# Patient Record
Sex: Female | Born: 1985 | Race: Black or African American | Hispanic: No | Marital: Single
Health system: Southern US, Community
[De-identification: ages and names within clinical notes are randomized; demographics above are authoritative.]

## PROBLEM LIST (undated history)

## (undated) DIAGNOSIS — N809 Endometriosis, unspecified: Secondary | ICD-10-CM

## (undated) HISTORY — PX: CHOLECYSTECTOMY: SHX55

---

## 2017-01-04 ENCOUNTER — Emergency Department: Payer: Medicaid HMO

## 2017-01-04 ENCOUNTER — Emergency Department
Admission: EM | Admit: 2017-01-04 | Discharge: 2017-01-04 | Disposition: A | Discharge status: Home / Self Care | Payer: Medicaid HMO | Attending: Emergency Medicine | Admitting: Emergency Medicine

## 2017-01-04 DIAGNOSIS — R509 Fever, unspecified: Secondary | ICD-10-CM | POA: Insufficient documentation

## 2017-01-04 DIAGNOSIS — R6889 Other general symptoms and signs: Secondary | ICD-10-CM

## 2017-01-04 DIAGNOSIS — R1013 Epigastric pain: Secondary | ICD-10-CM | POA: Insufficient documentation

## 2017-01-04 DIAGNOSIS — R05 Cough: Secondary | ICD-10-CM | POA: Insufficient documentation

## 2017-01-04 DIAGNOSIS — J029 Acute pharyngitis, unspecified: Secondary | ICD-10-CM | POA: Insufficient documentation

## 2017-01-04 LAB — CBC AND DIFFERENTIAL
Absolute NRBC: 0 10*3/uL
Basophils Absolute Automated: 0.01 10*3/uL (ref 0.00–0.20)
Basophils Automated: 0.1 %
Eosinophils Absolute Automated: 0.12 10*3/uL (ref 0.00–0.70)
Eosinophils Automated: 1.8 %
Hematocrit: 37.4 % (ref 37.0–47.0)
Hgb: 12 g/dL (ref 12.0–16.0)
Immature Granulocytes Absolute: 0.03 10*3/uL
Immature Granulocytes: 0.4 %
Lymphocytes Absolute Automated: 1.91 10*3/uL (ref 0.50–4.40)
Lymphocytes Automated: 28.3 %
MCH: 27.8 pg — ABNORMAL LOW (ref 28.0–32.0)
MCHC: 32.1 g/dL (ref 32.0–36.0)
MCV: 86.6 fL (ref 80.0–100.0)
MPV: 8.9 fL — ABNORMAL LOW (ref 9.4–12.3)
Monocytes Absolute Automated: 0.47 10*3/uL (ref 0.00–1.20)
Monocytes: 7 %
Neutrophils Absolute: 4.22 10*3/uL (ref 1.80–8.10)
Neutrophils: 62.4 %
Nucleated RBC: 0 /100 WBC (ref 0.0–1.0)
Platelets: 243 10*3/uL (ref 140–400)
RBC: 4.32 10*6/uL (ref 4.20–5.40)
RDW: 13 % (ref 12–15)
WBC: 6.76 10*3/uL (ref 3.50–10.80)

## 2017-01-04 LAB — URINALYSIS WITH MICROSCOPIC
Bilirubin, UA: NEGATIVE
Blood, UA: NEGATIVE
Glucose, UA: NEGATIVE
Ketones UA: NEGATIVE
Leukocyte Esterase, UA: NEGATIVE
Nitrite, UA: NEGATIVE
Protein, UR: NEGATIVE
Specific Gravity UA: 1.013 (ref 1.001–1.035)
Urine pH: 7 (ref 5.0–8.0)
Urobilinogen, UA: NEGATIVE mg/dL

## 2017-01-04 LAB — COMPREHENSIVE METABOLIC PANEL
ALT: 11 U/L (ref 0–55)
AST (SGOT): 16 U/L (ref 5–34)
Albumin/Globulin Ratio: 1.1 (ref 0.9–2.2)
Albumin: 3 g/dL — ABNORMAL LOW (ref 3.5–5.0)
Alkaline Phosphatase: 79 U/L (ref 37–106)
Anion Gap: 7 (ref 5.0–15.0)
BUN: 7 mg/dL (ref 7.0–19.0)
Bilirubin, Total: 0.5 mg/dL (ref 0.2–1.2)
CO2: 25 mEq/L (ref 22–29)
Calcium: 8.6 mg/dL (ref 8.5–10.5)
Chloride: 110 mEq/L (ref 100–111)
Creatinine: 0.7 mg/dL (ref 0.6–1.0)
Globulin: 2.7 g/dL (ref 2.0–3.6)
Glucose: 89 mg/dL (ref 70–100)
Potassium: 4.1 mEq/L (ref 3.5–5.1)
Protein, Total: 5.7 g/dL — ABNORMAL LOW (ref 6.0–8.3)
Sodium: 142 mEq/L (ref 136–145)

## 2017-01-04 LAB — HEMOLYSIS INDEX: Hemolysis Index: 2 (ref 0–18)

## 2017-01-04 LAB — HCG QUANTITATIVE: hCG, Quant.: <1.2

## 2017-01-04 LAB — LIPASE: Lipase: 53 U/L (ref 8–78)

## 2017-01-04 LAB — GFR: EGFR: >60

## 2017-01-04 MED ORDER — MORPHINE SULFATE 10 MG/ML IJ/IV SOLN (WRAP)
6.0000 mg | Freq: Once | Status: AC
Start: 2017-01-04 — End: 2017-01-04
  Administered 2017-01-04: 6 mg via INTRAVENOUS
  Filled 2017-01-04: qty 1

## 2017-01-04 MED ORDER — GUAIFENESIN-CODEINE 100-10 MG/5ML PO SYRP
5.0000 mL | ORAL_SOLUTION | Freq: Three times a day (TID) | ORAL | 0 refills | Status: AC | PRN
Start: 2017-01-04 — End: ?

## 2017-01-04 MED ORDER — IOHEXOL 350 MG/ML IV SOLN
100.0000 mL | Freq: Once | INTRAVENOUS | Status: AC | PRN
Start: 2017-01-04 — End: 2017-01-04
  Administered 2017-01-04: 100 mL via INTRAVENOUS

## 2017-01-04 MED ORDER — DICYCLOMINE HCL 10 MG PO CAPS
10.0000 mg | ORAL_CAPSULE | Freq: Four times a day (QID) | ORAL | 0 refills | Status: AC | PRN
Start: 2017-01-04 — End: 2017-01-19

## 2017-01-04 MED ORDER — SODIUM CHLORIDE 0.9 % IV BOLUS
1000.0000 mL | Freq: Once | INTRAVENOUS | Status: AC
Start: 2017-01-04 — End: 2017-01-04
  Administered 2017-01-04: 1000 mL via INTRAVENOUS

## 2017-01-04 MED ORDER — ONDANSETRON 4 MG PO TBDP
4.0000 mg | ORAL_TABLET | Freq: Three times a day (TID) | ORAL | 0 refills | Status: AC | PRN
Start: 2017-01-04 — End: ?

## 2017-01-04 MED ORDER — MORPHINE SULFATE 4 MG/ML IJ/IV SOLN (WRAP)
4.0000 mg | Freq: Once | Status: AC
Start: 2017-01-04 — End: 2017-01-04
  Administered 2017-01-04: 4 mg via INTRAVENOUS
  Filled 2017-01-04: qty 1

## 2017-01-04 MED ORDER — NALOXONE HCL 4 MG/0.1ML NA LIQD
NASAL | 0 refills | Status: AC
Start: 2017-01-04 — End: ?

## 2017-01-04 MED ORDER — ONDANSETRON HCL 4 MG/2ML IJ SOLN
4.0000 mg | Freq: Once | INTRAMUSCULAR | Status: AC
Start: 2017-01-04 — End: 2017-01-04
  Administered 2017-01-04: 4 mg via INTRAVENOUS
  Filled 2017-01-04: qty 2

## 2017-01-04 NOTE — Discharge Instructions (Signed)
Abdominal Pain    You have been diagnosed with abdominal (belly) pain. The cause of your pain is not yet known.    Many things can cause abdominal pain. Examples include viral infections and bowel (intestine) spasms. You might need another examination or more tests to find out why you have pain.    At this time, your pain does not seem to be caused by anything dangerous. You do not need surgery. You do not need to stay in the hospital.     Though we don't believe your condition is dangerous right now, it is important to be careful. Sometimes a problem that seems mild can become serious later. This is why it is very important that you return here or go to the nearest Emergency Department unless you are 100% improved.    Return here or go to the nearest Emergency Department, or follow up with your physician in:   2-3 days.    Drink only clear liquids such as water, clear broth, sports drinks, or clear caffeine-free soft drinks, like 7-Up or Sprite, for the next:   24 hours.    YOU SHOULD SEEK MEDICAL ATTENTION IMMEDIATELY, EITHER HERE OR AT THE NEAREST EMERGENCY DEPARTMENT, IF ANY OF THE FOLLOWING OCCURS:   Your pain does not go away or gets worse.   You cannot keep fluids down or your vomit is dark green.    You vomit blood or see blood in your stool. Blood might be bright red or dark red. It can also be black and look like tar.   You have a fever (temperature higher than 100.4F / 38C) or shaking chills.   Your skin or eyes look yellow or your urine looks brown.   You have severe diarrhea.

## 2017-01-04 NOTE — ED Notes (Signed)
Bed: BL[facility]1  Expected date:   Expected time:   Means of arrival:   Comments:  Medic 842-abdominal pain

## 2017-01-04 NOTE — ED Triage Notes (Signed)
Becky Wilson is a 31 y.o. female BIBA complaining of flu like symptoms epigastric pain and nausea and vomiting x1. Hx of gastric sleeve.BP 132/81   Pulse 77   Temp 97.7 F (36.5 C)   Resp 18   SpO2 99%

## 2017-01-04 NOTE — ED Provider Notes (Signed)
EMERGENCY DEPARTMENT HISTORY AND PHYSICAL EXAM     Physician/Midlevel provider first contact with patient: 01/04/17 1128         Date: 01/04/2017  Patient Name: Becky Wilson    History of Presenting Illness     Chief Complaint   Patient presents with   . Abdominal Pain       History Provided By: Patient    Chief Complaint: Abdominal pain   Duration: past few days   Timing:  Constant  Location: Epigastric   Severity: Moderate  Exacerbating factors: None   Alleviating factors: None   Associated Symptoms: Nausea, vomiting, cough, rhinorrhea, sore throat, fever, myalgias, back pain   Pertinent Negatives: No diarrhea     Additional History: Becky Wilson is a 31 y.o. female with Hx of gastric sleeve surgery presenting to the ED with for epigastric pain, nausea, and vomiting for the past few days. Patient also states she has had a cough, sore throat, rhinorrhea, and fever the past 5 days. She thinks she might have the flu. She reports eating [employer] a few days ago and states she has been recently dehydrated. She also complains of back pain. She denies any diarrhea.     PCP: No primary care provider on file.  SPECIALISTS:    No current facility-administered medications for this encounter.      Current Outpatient Prescriptions   Medication Sig Dispense Refill   . acetaminophen-codeine (TYLENOL #3) 300-30 MG per tablet TAKE 1 TABLET BY MOUTH EVERY 4 TO 6 HOURS AS NEEDED FOR PAIN  0   . dicyclomine (BENTYL) 10 MG capsule Take 1 capsule (10 mg total) by mouth every 6 (six) hours as needed (pain).for up to 15 days 12 capsule 0   . guaiFENesin-codeine (ROBITUSSIN AC) 100-10 MG/5ML syrup Take 5 mLs by mouth 3 (three) times daily as needed for Cough. 118 mL 0   . naloxone (NARCAN) 4 MG/0.1ML Liquid nasal spray 1 spray intranasally. If pt does not respond or relapses into respiratory depression call 911. Give additional doses every 2-3 min. 2 each 0   . ondansetron (ZOFRAN-ODT) 4 MG disintegrating tablet Take 1 tablet (4 mg  total) by mouth every 8 (eight) hours as needed for Nausea. 8 tablet 0   . VENTOLIN HFA 108 (90 Base) MCG/ACT inhaler TAKE 2 PUFFS BY MOUTH EVERY 3 HOURS PRN FOR WHEEZING HOME AND SCHOOL  4       Past History     Past Medical History:  History reviewed. No pertinent past medical history.    Past Surgical History:  Past Surgical History:   Procedure Laterality Date   . LAPAROSCOPIC, GASTRIC BYPASS, CONVERSION FROM SLEEVE GASTRECTOMY         Family History:  No family history on file.    Social History:  Social History   Substance Use Topics   . Smoking status: Not on file   . Smokeless tobacco: Not on file   . Alcohol use No       Allergies:  No Known Allergies    Review of Systems     Review of Systems   Constitutional: Positive for fever. Negative for fatigue.   HENT: Positive for sore throat. Negative for rhinorrhea.    Eyes: Negative for discharge, redness and visual disturbance.   Respiratory: Positive for cough. Negative for shortness of breath.    Cardiovascular: Negative for chest pain and leg swelling.   Gastrointestinal: Positive for abdominal pain, nausea and vomiting. Negative for diarrhea.  Endocrine: Negative for polyuria.   Genitourinary: Negative for dysuria, flank pain, frequency and urgency.   Musculoskeletal: Positive for back pain. Negative for neck pain and neck stiffness.   Skin: Negative for rash.   Allergic/Immunologic: Negative for immunocompromised state.   Neurological: Negative for light-headedness and headaches.   Hematological: Does not bruise/bleed easily.   Psychiatric/Behavioral: Negative for suicidal ideas.       Physical Exam   BP 115/65   Pulse 77   Temp 97.6 F (36.4 C)   Resp 16   SpO2 99%     Constitutional: Vital signs reviewed. Well appearing. No distress.  Head: Normocephalic, atraumatic  Eyes: Conjunctiva and sclera are normal.  No injection or discharge.  Ears, Nose, Throat:  Normal external examination of the ears. (+) clear rhinorrhea. No throat or oropharyngeal  swelling or erythema. Midline uvula. Mucous membranes moist.  Neck: Normal range of motion. Supple, no meningeal signs. Trachea midline. No stridor. No JVD  Respiratory/Chest: Clear to auscultation. No respiratory distress.   Cardiovascular: Regular rate and rhythm. No murmurs.  Abdomen:  Bowel sounds intact. No rebound or guarding. Soft.  (+) epigastric TTP. No other tenderness.   Back: No CVA tenderness to percussion. No focal tenderness.  Upper Extremity:  No edema. No cyanosis. Bilateral radial pulses intact and equal.   Lower Extremity:  No edema. No cyanosis. Bilateral calves symmetrical and non-tender. Bilateral femoral, DP, PT pulses intact and equal.  Skin: Warm and dry. No rash.  Neuro: A&Ox3. CNII -XII intact to testing. Strength 5/5 and symmetrical in the bilateral upper and lower extremities. Sensation to sharp touch intact and equal in the bilateral upper and lower extremities. Normal gait.   Psychiatric: Normal affect.  Normal insight.        Diagnostic Study Results     Labs -     Results     Procedure Component Value Units Date/Time    Urinalysis with microscopic [[id number]] Collected:  01/04/17 1155    Specimen:  Urine Updated:  01/04/17 1314     Urine Type Clean Catch     Color, UA Yellow     Clarity, UA Hazy     Specific Gravity UA 1.013     Urine pH 7.0     Leukocyte Esterase, UA Negative     Nitrite, UA Negative     Protein, UR Negative     Glucose, UA Negative     Ketones UA Negative     Urobilinogen, UA Negative mg/dL      Bilirubin, UA Negative     Blood, UA Negative     RBC, UA 0 - 2 /hpf      WBC, UA 0 - 5 /hpf      Squamous Epithelial Cells, Urine 0 - 5 /hpf     Beta HCG Quant Serum [[id number]] Collected:  01/04/17 1156     Updated:  01/04/17 1233     hCG, Quant. <1.2    Comprehensive Metabolic Panel (CMP) [[id number]]  (Abnormal) Collected:  01/04/17 1156    Specimen:  Blood Updated:  01/04/17 1227     Glucose 89 mg/dL      BUN 7.0 mg/dL      Creatinine 0.7 mg/dL      Sodium 956 mEq/L       Potassium 4.1 mEq/L      Chloride 110 mEq/L      CO2 25 mEq/L      Calcium 8.6 mg/dL  Protein, Total 5.7 (L) g/dL      Albumin 3.0 (L) g/dL      AST (SGOT) 16 U/L      ALT 11 U/L      Alkaline Phosphatase 79 U/L      Bilirubin, Total 0.5 mg/dL      Globulin 2.7 g/dL      Albumin/Globulin Ratio 1.1     Anion Gap 7.0    Lipase [[id number]] Collected:  01/04/17 1156    Specimen:  Blood Updated:  01/04/17 1227     Lipase 53 U/L     Hemolysis index [[id number]] Collected:  01/04/17 1156     Updated:  01/04/17 1227     Hemolysis Index 2    GFR [[id number]] Collected:  01/04/17 1156     Updated:  01/04/17 1227     EGFR >60.0    Influenza A/B Virus Antigen [[id number]] Collected:  01/04/17 1156    Specimen:  Nasopharyngeal from Nasal Aspirate Updated:  01/04/17 1224    Narrative:       ORDER#: [id number]                                    ORDERED BY: Lucianne Muss, Sosie Gato  SOURCE: Nasal Aspirate                               COLLECTED:  01/04/17 11:56  ANTIBIOTICS AT COLL.:                                RECEIVED :  01/04/17 12:01  Influenza Rapid Antigen A&B                FINAL       01/04/17 12:23  01/04/17   Negative for Influenza A and B             Reference Range: Negative      CBC with differential [[id number]]  (Abnormal) Collected:  01/04/17 1156    Specimen:  Blood from Blood Updated:  01/04/17 1208     WBC 6.76 x10 3/uL      Hgb 12.0 g/dL      Hematocrit 53.6 %      Platelets 243 x10 3/uL      RBC 4.32 x10 6/uL      MCV 86.6 fL      MCH 27.8 (L) pg      MCHC 32.1 g/dL      RDW 13 %      MPV 8.9 (L) fL      Neutrophils 62.4 %      Lymphocytes Automated 28.3 %      Monocytes 7.0 %      Eosinophils Automated 1.8 %      Basophils Automated 0.1 %      Immature Granulocyte 0.4 %      Nucleated RBC 0.0 /100 WBC      Neutrophils Absolute 4.22 x10 3/uL      Abs Lymph Automated 1.91 x10 3/uL      Abs Mono Automated 0.47 x10 3/uL      Abs Eos Automated 0.12 x10 3/uL      Absolute Baso Automated 0.01 x10 3/uL      Absolute Immature  Granulocyte 0.03 x10 3/uL      Absolute  NRBC 0.00 x10 3/uL           Radiologic Studies -   Radiology Results (24 Hour)     Procedure Component Value Units Date/Time    CT Abd/ Pelvis with IV Contrast [[id number]] Collected:  01/04/17 1335    Order Status:  Completed Updated:  01/04/17 1341    Narrative:       CLINICAL HISTORY:Abdominal pain. Vomiting x1 day.    COMPARISON: None    TECHNIQUE: Multiple axial CT images through the abdomen pelvis were  obtained following the administration of intravenous contrast. 100 cc of  Omnipaque 350 was administered .  A combination of a automatic exposure  control, adjustment of the mA and/or kV  according to patient size  and/or use of iterative reconstruction technique was utilized.    FINDINGS: There is no basilar pleural or pericardial effusion. Lung  bases are clear.    The liver is normal in attenuation morphology. There is no focal hepatic  lesion. The gallbladder, spleen, adrenal glands, and pancreas are  normal. There is no intra-axial hepatic biliary ductal dilatation.    Kidneys enhance symmetrically. There is no focal renal abnormality or  hydronephrosis. Bladder is grossly normal. Uterus and ovaries are  unremarkable. There is a physiologic cyst in the left ovary which  measures 2.5 cm.    Posterior skull changes of the stomach are noted. There is no evidence  of bowel obstruction or bowel dilatation. The appendix is normal. There  is no abdominal aortic aneurysm. The major mesenteric vessels are  patent. There is no abdominal or pelvic lymphadenopathy. There is trace  free fluid in the cul-de-sac. There is no free air.    There are no destructive osseous lesions.      Impression:        No acute abnormality identified in the abdomen or pelvis.    Wyatt Portela, MD   01/04/2017 1:37 PM      .    Medical Decision Making   I am the first provider for this patient.    I reviewed the vital signs, available nursing notes, past medical history, past surgical history, family  history and social history.    Vital Signs-Reviewed the patient's vital signs.     Patient Vitals for the past 12 hrs:   BP Temp Pulse Resp   01/04/17 1421 115/65 97.6 F (36.4 C) 77 16   01/04/17 1132 132/81 97.7 F (36.5 C) 77 18       Pulse Oximetry Analysis - Normal 99% on RA    Old Medical Records: Nursing notes.     ED Course:   2:16 PM - Abdomen non-tender on repeat exam and pt tolerating PO. Patient is resting comfortably and agrees with plan for discharge. Discussed results with pt and counseled on diagnosis, f/u plans, medication use, supportive care, and signs and symptoms when to return to ED immediately. Pt voices understanding and agreement with plan. All questions and concerns addressed.           Provider Notes: Pt with constellation of symptoms consistent with acute viral symptoms. No significant abnormalities on labs or vital signs. Doubt perforated ulcer, acute cholecystitis, pancreatitis, bowel obstruction, appendicitis, mesenteric ischemia, ruptured AAA, aortic dissection, large obstructing stone, ovarian torsion, or other surgical emergency.           Diagnosis     Clinical Impression:   1. Epigastric abdominal pain    2. Flu-like symptoms  Treatment Plan:   ED Disposition     ED Disposition Condition Date/Time Comment    Discharge  Fri Jan 04, 2017  2:17 PM Becky Wilson discharge to home/self care.    Condition at disposition: Stable            _______________________________      Attestations: This note is prepared by Jaymes Graff, acting as scribe for Dr. Lynnea Ferrier, MD.    Dr. Lynnea Ferrier, MD - The scribe's documentation has been prepared under my direction and personally reviewed by me in its entirety.  I confirm that the note above accurately reflects all work, treatment, procedures, and medical decision making performed by me.    _______________________________       Maryella Shivers, MD  01/05/17 [phone]

## 2019-12-28 ENCOUNTER — Other Ambulatory Visit: Payer: Self-pay

## 2019-12-28 ENCOUNTER — Emergency Department (HOSPITAL_COMMUNITY): Payer: Medicaid - Out of State

## 2019-12-28 ENCOUNTER — Emergency Department (HOSPITAL_COMMUNITY)
Admission: EM | Admit: 2019-12-28 | Discharge: 2019-12-29 | Disposition: A | Discharge status: Home / Self Care | Payer: Medicaid - Out of State | Attending: Emergency Medicine | Admitting: Emergency Medicine

## 2019-12-28 ENCOUNTER — Encounter (HOSPITAL_COMMUNITY): Payer: Self-pay | Admitting: Emergency Medicine

## 2019-12-28 DIAGNOSIS — R42 Dizziness and giddiness: Secondary | ICD-10-CM

## 2019-12-28 DIAGNOSIS — L509 Urticaria, unspecified: Secondary | ICD-10-CM | POA: Insufficient documentation

## 2019-12-28 DIAGNOSIS — R519 Headache, unspecified: Secondary | ICD-10-CM

## 2019-12-28 HISTORY — DX: Endometriosis, unspecified: N80.9

## 2019-12-28 LAB — I-STAT CHEM 8, ED
BUN: 9 mg/dL (ref 6–20)
Calcium, Ion: 1.19 mmol/L (ref 1.15–1.40)
Chloride: 105 mmol/L (ref 98–111)
Creatinine, Ser: 0.7 mg/dL (ref 0.44–1.00)
Glucose, Bld: 89 mg/dL (ref 70–99)
HCT: 35 % — ABNORMAL LOW (ref 36.0–46.0)
Hemoglobin: 11.9 g/dL — ABNORMAL LOW (ref 12.0–15.0)
Potassium: 3.8 mmol/L (ref 3.5–5.1)
Sodium: 139 mmol/L (ref 135–145)
TCO2: 22 mmol/L (ref 22–32)

## 2019-12-28 LAB — DIFFERENTIAL
Abs Immature Granulocytes: 0.01 10*3/uL (ref 0.00–0.07)
Basophils Absolute: 0 10*3/uL (ref 0.0–0.1)
Basophils Relative: 0 %
Eosinophils Absolute: 0.1 10*3/uL (ref 0.0–0.5)
Eosinophils Relative: 2 %
Immature Granulocytes: 0 %
Lymphocytes Relative: 44 %
Lymphs Abs: 2.8 10*3/uL (ref 0.7–4.0)
Monocytes Absolute: 0.5 10*3/uL (ref 0.1–1.0)
Monocytes Relative: 8 %
Neutro Abs: 2.9 10*3/uL (ref 1.7–7.7)
Neutrophils Relative %: 46 %

## 2019-12-28 LAB — COMPREHENSIVE METABOLIC PANEL
ALT: 12 U/L (ref 0–44)
AST: 15 U/L (ref 15–41)
Albumin: 3.1 g/dL — ABNORMAL LOW (ref 3.5–5.0)
Alkaline Phosphatase: 49 U/L (ref 38–126)
Anion gap: 8 (ref 5–15)
BUN: 8 mg/dL (ref 6–20)
CO2: 22 mmol/L (ref 22–32)
Calcium: 8.5 mg/dL — ABNORMAL LOW (ref 8.9–10.3)
Chloride: 109 mmol/L (ref 98–111)
Creatinine, Ser: 0.91 mg/dL (ref 0.44–1.00)
GFR calc Af Amer: >60 mL/min (ref >60)
GFR calc non Af Amer: >60 mL/min (ref >60)
Glucose, Bld: 91 mg/dL (ref 70–99)
Potassium: 3.8 mmol/L (ref 3.5–5.1)
Sodium: 139 mmol/L (ref 135–145)
Total Bilirubin: 0.4 mg/dL (ref 0.3–1.2)
Total Protein: 6.2 g/dL — ABNORMAL LOW (ref 6.5–8.1)

## 2019-12-28 LAB — URINALYSIS, ROUTINE W REFLEX MICROSCOPIC
Bilirubin Urine: NEGATIVE
Glucose, UA: NEGATIVE mg/dL
Hgb urine dipstick: NEGATIVE
Ketones, ur: NEGATIVE mg/dL
Leukocytes,Ua: NEGATIVE
Nitrite: NEGATIVE
Protein, ur: NEGATIVE mg/dL
Specific Gravity, Urine: 1.018 (ref 1.005–1.030)
pH: 6 (ref 5.0–8.0)

## 2019-12-28 LAB — CBC
HCT: 36.8 % (ref 36.0–46.0)
Hemoglobin: 11.6 g/dL — ABNORMAL LOW (ref 12.0–15.0)
MCH: 26.7 pg (ref 26.0–34.0)
MCHC: 31.5 g/dL (ref 30.0–36.0)
MCV: 84.8 fL (ref 80.0–100.0)
Platelets: 334 10*3/uL (ref 150–400)
RBC: 4.34 MIL/uL (ref 3.87–5.11)
RDW: 13.3 % (ref 11.5–15.5)
WBC: 6.3 10*3/uL (ref 4.0–10.5)
nRBC: 0 % (ref 0.0–0.2)

## 2019-12-28 LAB — PROTIME-INR
INR: 1 (ref 0.8–1.2)
Prothrombin Time: 13 seconds (ref 11.4–15.2)

## 2019-12-28 LAB — I-STAT BETA HCG BLOOD, ED (MC, WL, AP ONLY): I-stat hCG, quantitative: <5 m[IU]/mL (ref <5)

## 2019-12-28 LAB — APTT: aPTT: 29 seconds (ref 24–36)

## 2019-12-28 MED ORDER — SODIUM CHLORIDE 0.9% FLUSH
3.0000 mL | Freq: Once | INTRAVENOUS | Status: DC
Start: 2019-12-28 — End: 2019-12-29

## 2019-12-28 NOTE — ED Triage Notes (Signed)
Pt in POV, reports HA, dizziness, blurred vision, diaphoresis, pt reports she is confused.

## 2019-12-29 LAB — RAPID URINE DRUG SCREEN, HOSP PERFORMED
Amphetamines: NOT DETECTED
Barbiturates: NOT DETECTED
Benzodiazepines: NOT DETECTED
Cocaine: NOT DETECTED
Opiates: NOT DETECTED
Tetrahydrocannabinol: NOT DETECTED

## 2019-12-29 LAB — TSH: TSH: 3.043 u[IU]/mL (ref 0.350–4.500)

## 2019-12-29 MED ORDER — SODIUM CHLORIDE 0.9 % IV BOLUS
1000.0000 mL | Freq: Once | INTRAVENOUS | Status: AC
  Administered 2019-12-29: 1000 mL via INTRAVENOUS

## 2019-12-29 MED ORDER — FAMOTIDINE IN NACL 20-0.9 MG/50ML-% IV SOLN
20.0000 mg | Freq: Once | INTRAVENOUS | Status: AC
  Administered 2019-12-29: 20 mg via INTRAVENOUS
  Filled 2019-12-29: qty 50

## 2019-12-29 MED ORDER — MECLIZINE HCL 25 MG PO TABS
25.0000 mg | ORAL_TABLET | Freq: Once | ORAL | Status: AC
  Administered 2019-12-29: 25 mg via ORAL
  Filled 2019-12-29: qty 1

## 2019-12-29 MED ORDER — DIPHENHYDRAMINE HCL 50 MG/ML IJ SOLN
25.0000 mg | Freq: Once | INTRAMUSCULAR | Status: AC
  Administered 2019-12-29: 01:00:00 25 mg via INTRAVENOUS
  Filled 2019-12-29: qty 1

## 2019-12-29 MED ORDER — METHYLPREDNISOLONE SODIUM SUCC 125 MG IJ SOLR
125.0000 mg | Freq: Once | INTRAMUSCULAR | Status: AC
  Administered 2019-12-29: 01:00:00 125 mg via INTRAVENOUS
  Filled 2019-12-29: qty 2

## 2019-12-29 MED ORDER — MECLIZINE HCL 25 MG PO TABS: 25.0000 mg | ORAL_TABLET | Freq: Two times a day (BID) | ORAL | 0 refills | Status: AC | PRN

## 2019-12-29 NOTE — ED Notes (Signed)
Pt was discharged from the ED. Pt read and understood discharge paperwork. Pt had vital signs completed. E-signature not available. Pt conscious, breathing, and A&Ox4. No distress noted. Pt speaking in complete sentences. Pt brought out of the ED via wheelchair.

## 2019-12-29 NOTE — ED Notes (Signed)
Pt came to the ED per triage complaint. Pt conscious, breathing, and A&Ox4. Pt brought back to [facility] via wheelchair. Pt endorses I have a headache, dizziness, blurred vision and sweating.". Chest rise and fall equally with non-labored breathing. Lungs clear apex to base. Abd soft and non-tender. Pt denies chest pain, n/v/d, shortness of breath, and f/c. PIVC placed on the LAC with a 20G which had positive blood return and flushed without pain or infiltration. Blood collected, labeled, and sent to lab. Bed in lowest position with call light within reach. Pt on continuous blood pressure, pulse ox, and cardiac monitor. Will continue to monitor. Awaiting MD eval. No distress noted.

## 2019-12-29 NOTE — ED Provider Notes (Addendum)
[facility] EMERGENCY DEPARTMENT Provider Note   CSN: [id number] Arrival date & time: 12/28/19  1754     History Chief Complaint  Patient presents with  . Dizziness    Emily Woods is a 34 y.o. female.  Patient presents to the emergency department with a chief complaint of dizziness.  She states that over the past few days she has had dizziness, she states that she feels like she is "on a raft."  She states that her symptoms worsen with head movement.  She also reports some associated blurred vision states that she feels confused at times.  She states that it feels like she is not getting enough rest at home, and she has had trouble sleeping.  She reports associated headache, and reports history of migraines.  She states that she has also been under a lot of stress at work.  Denies any treatments prior to arrival.  The history is provided by the patient. No language interpreter was used.       Past Medical History:  Diagnosis Date  . Endometriosis     There are no problems to display for this patient.   Past Surgical History:  Procedure Laterality Date  . CHOLECYSTECTOMY       OB History   No obstetric history on file.     No family history on file.  Social History   Tobacco Use  . Smoking status: Not on file  Substance Use Topics  . Alcohol use: Not on file  . Drug use: Not on file    Home Medications Prior to Admission medications   Not on File    Allergies    Patient has no known allergies.  Review of Systems   Review of Systems  All other systems reviewed and are negative.   Physical Exam Updated Vital Signs BP (!) 144/89 (BP Location: Right Arm)   Pulse 72   Temp 98.7 F (37.1 C) (Oral)   Resp 19   Ht 5\' 4"  (1.626 m)   Wt 122.5 kg   SpO2 100%   BMI 46.35 kg/m   Physical Exam Vitals and nursing note reviewed.  Constitutional:      General: She is not in acute distress.    Appearance: She is well-developed.    HENT:     Head: Normocephalic and atraumatic.  Eyes:     Conjunctiva/sclera: Conjunctivae normal.     Comments: Horizontal nystagmus  Cardiovascular:     Rate and Rhythm: Normal rate and regular rhythm.     Heart sounds: No murmur.  Pulmonary:     Effort: Pulmonary effort is normal. No respiratory distress.     Breath sounds: Normal breath sounds.  Abdominal:     Palpations: Abdomen is soft.     Tenderness: There is no abdominal tenderness.  Musculoskeletal:        General: Normal range of motion.     Cervical back: Neck supple.     Comments: Normal sensation and strength in extremities  Skin:    General: Skin is warm and dry.     Comments: Mild urticaria  Neurological:     Mental Status: She is alert and oriented to person, place, and time.     Comments: Cranial nerves grossly intact, clear speech, movements are goal oriented  Psychiatric:        Mood and Affect: Mood normal.        Behavior: Behavior normal.     ED Results /  Procedures / Treatments   Labs (all labs ordered are listed, but only abnormal results are displayed) Labs Reviewed  CBC - Abnormal; Notable for the following components:      Result Value   Hemoglobin 11.6 (*)    All other components within normal limits  COMPREHENSIVE METABOLIC PANEL - Abnormal; Notable for the following components:   Calcium 8.5 (*)    Total Protein 6.2 (*)    Albumin 3.1 (*)    All other components within normal limits  I-STAT CHEM 8, ED - Abnormal; Notable for the following components:   Hemoglobin 11.9 (*)    HCT 35.0 (*)    All other components within normal limits  PROTIME-INR  APTT  DIFFERENTIAL  URINALYSIS, ROUTINE W REFLEX MICROSCOPIC  RAPID URINE DRUG SCREEN, HOSP PERFORMED  TSH  I-STAT BETA HCG BLOOD, ED ([facility], [facility], [facility] ONLY)    EKG None  Radiology CT HEAD WO CONTRAST  Result Date: 12/28/2019 CLINICAL DATA:  Headache and dizziness EXAM: CT HEAD WITHOUT CONTRAST TECHNIQUE: Contiguous axial images were  obtained from the base of the skull through the vertex without intravenous contrast. COMPARISON:  None. FINDINGS: Brain: There is no mass, hemorrhage or extra-axial collection. The size and configuration of the ventricles and extra-axial CSF spaces are normal. The brain parenchyma is normal, without acute or chronic infarction. Vascular: No abnormal hyperdensity of the major intracranial arteries or dural venous sinuses. No intracranial atherosclerosis. Skull: The visualized skull base, calvarium and extracranial soft tissues are normal. Sinuses/Orbits: No fluid levels or advanced mucosal thickening of the visualized paranasal sinuses. No mastoid or middle ear effusion. The orbits are normal. IMPRESSION: Normal head CT. Electronically Signed   By: Ulyses Jarred M.D.   On: 12/28/2019 19:27    Procedures Procedures (including critical care time)  Medications Ordered in ED Medications  sodium chloride flush (NS) 0.9 % injection 3 mL (has no administration in time range)  methylPREDNISolone sodium succinate (SOLU-MEDROL) 125 mg/2 mL injection 125 mg (has no administration in time range)  diphenhydrAMINE (BENADRYL) injection 25 mg (has no administration in time range)  sodium chloride 0.9 % bolus 1,000 mL (has no administration in time range)  famotidine (PEPCID) IVPB 20 mg premix (has no administration in time range)  meclizine (ANTIVERT) tablet 25 mg (has no administration in time range)    ED Course  I have reviewed the triage vital signs and the nursing notes.  Pertinent labs & imaging results that were available during my care of the patient were reviewed by me and considered in my medical decision making (see chart for details).    MDM Rules/Calculators/A&P                      Patient with several complaints.   1. Dizziness:  Thought to be vertigo.  States that it feels like she is floating down a river.  Reproducible with head movement.  +Horizontal nystagmus.  Will given  meclizine.  2. Headache:  Has hx of migraines, states she has been getting them more frequently, will give decadron and benadryl  3. Hives: Present on upper extremities and some on low back, she says they have been moving.  Will treat with decadron, benadryl, and pepcid.  Labs are very reassuring.  Mildly anemic.  Mild hypocalcemia.  CT head negative.  No EKG abnormalities.  Will check TSH.  Overall, patient is non-toxic appearing.  I doubt central origin, given age, lack of risk factors, and symptoms seeming  consistent with vertigo.  I suspect that we can help the patient to feel better here and plan for discharge with close outpatient follow-up.  TSH is normal.  Headache has nearly resolved.  Hives are improved.  Dizziness is improved.    Case discussed with Dr. Wilkie Aye, who agrees with outpatient follow-up.  Doubt the need for advanced imaging tonight, no numbness, weakness, vision loss or diplopia.    Final Clinical Impression(s) / ED Diagnoses Final diagnoses:  Vertigo  Acute nonintractable headache, unspecified headache type  Hives    Rx / DC Orders ED Discharge Orders         Ordered    meclizine (ANTIVERT) 25 MG tablet  2 times daily PRN     12/29/19 0248           Roxy Horseman, PA-C 12/29/19 0250    Roxy Horseman, PA-C 12/29/19 3335    Wilkie Aye Mayer Masker, MD 12/30/19 [phone]

## 2019-12-29 NOTE — ED Notes (Signed)
Pt resting in bed. Pt denies new or worsening complaints. Will continue to monitor. No distress noted. Pt on continuous monitoring via blood pressure, pulse ox, and cardiac monitor.  

## 2020-03-30 ENCOUNTER — Other Ambulatory Visit: Payer: Self-pay

## 2020-03-30 ENCOUNTER — Emergency Department (HOSPITAL_COMMUNITY): Payer: Medicaid - Out of State

## 2020-03-30 ENCOUNTER — Encounter (HOSPITAL_COMMUNITY): Payer: Self-pay | Admitting: Emergency Medicine

## 2020-03-30 ENCOUNTER — Emergency Department (HOSPITAL_COMMUNITY)
Admission: EM | Admit: 2020-03-30 | Discharge: 2020-03-30 | Disposition: A | Discharge status: Home / Self Care | Payer: Medicaid - Out of State | Attending: Emergency Medicine | Admitting: Emergency Medicine

## 2020-03-30 DIAGNOSIS — Z79899 Other long term (current) drug therapy: Secondary | ICD-10-CM | POA: Diagnosis not present

## 2020-03-30 DIAGNOSIS — R6884 Jaw pain: Secondary | ICD-10-CM | POA: Insufficient documentation

## 2020-03-30 DIAGNOSIS — R42 Dizziness and giddiness: Secondary | ICD-10-CM | POA: Diagnosis present

## 2020-03-30 DIAGNOSIS — H66004 Acute suppurative otitis media without spontaneous rupture of ear drum, recurrent, right ear: Secondary | ICD-10-CM | POA: Diagnosis not present

## 2020-03-30 LAB — CBC WITH DIFFERENTIAL/PLATELET
Abs Immature Granulocytes: 0.01 10*3/uL (ref 0.00–0.07)
Basophils Absolute: 0 10*3/uL (ref 0.0–0.1)
Basophils Relative: 1 %
Eosinophils Absolute: 0 10*3/uL (ref 0.0–0.5)
Eosinophils Relative: 1 %
HCT: 40 % (ref 36.0–46.0)
Hemoglobin: 12.4 g/dL (ref 12.0–15.0)
Immature Granulocytes: 0 %
Lymphocytes Relative: 38 %
Lymphs Abs: 1.6 10*3/uL (ref 0.7–4.0)
MCH: 26.8 pg (ref 26.0–34.0)
MCHC: 31 g/dL (ref 30.0–36.0)
MCV: 86.4 fL (ref 80.0–100.0)
Monocytes Absolute: 0.4 10*3/uL (ref 0.1–1.0)
Monocytes Relative: 10 %
Neutro Abs: 2.1 10*3/uL (ref 1.7–7.7)
Neutrophils Relative %: 50 %
Platelets: 300 10*3/uL (ref 150–400)
RBC: 4.63 MIL/uL (ref 3.87–5.11)
RDW: 13.2 % (ref 11.5–15.5)
WBC: 4.2 10*3/uL (ref 4.0–10.5)
nRBC: 0 % (ref 0.0–0.2)

## 2020-03-30 LAB — URINALYSIS, ROUTINE W REFLEX MICROSCOPIC
Bilirubin Urine: NEGATIVE
Glucose, UA: NEGATIVE mg/dL
Hgb urine dipstick: NEGATIVE
Ketones, ur: NEGATIVE mg/dL
Leukocytes,Ua: NEGATIVE
Nitrite: NEGATIVE
Protein, ur: NEGATIVE mg/dL
Specific Gravity, Urine: 1.028 (ref 1.005–1.030)
pH: 6 (ref 5.0–8.0)

## 2020-03-30 LAB — COMPREHENSIVE METABOLIC PANEL
ALT: 11 U/L (ref 0–44)
AST: 16 U/L (ref 15–41)
Albumin: 3.1 g/dL — ABNORMAL LOW (ref 3.5–5.0)
Alkaline Phosphatase: 50 U/L (ref 38–126)
Anion gap: 9 (ref 5–15)
BUN: 9 mg/dL (ref 6–20)
CO2: 24 mmol/L (ref 22–32)
Calcium: 8.8 mg/dL — ABNORMAL LOW (ref 8.9–10.3)
Chloride: 108 mmol/L (ref 98–111)
Creatinine, Ser: 0.84 mg/dL (ref 0.44–1.00)
GFR calc Af Amer: >60 mL/min (ref >60)
GFR calc non Af Amer: >60 mL/min (ref >60)
Glucose, Bld: 96 mg/dL (ref 70–99)
Potassium: 3.6 mmol/L (ref 3.5–5.1)
Sodium: 141 mmol/L (ref 135–145)
Total Bilirubin: 0.5 mg/dL (ref 0.3–1.2)
Total Protein: 6.4 g/dL — ABNORMAL LOW (ref 6.5–8.1)

## 2020-03-30 LAB — WET PREP, GENITAL
Clue Cells Wet Prep HPF POC: NONE SEEN
Sperm: NONE SEEN
Trich, Wet Prep: NONE SEEN
Yeast Wet Prep HPF POC: NONE SEEN

## 2020-03-30 LAB — TROPONIN I (HIGH SENSITIVITY)
Troponin I (High Sensitivity): 2 ng/L (ref <18)
Troponin I (High Sensitivity): <2 ng/L (ref <18)

## 2020-03-30 LAB — PREGNANCY, URINE: Preg Test, Ur: NEGATIVE

## 2020-03-30 MED ORDER — HYDROCODONE-ACETAMINOPHEN 5-325 MG PO TABS: 1.0000 | ORAL_TABLET | ORAL | 0 refills | Status: AC | PRN

## 2020-03-30 MED ORDER — AZITHROMYCIN 250 MG PO TABS: ORAL_TABLET | ORAL | 0 refills | Status: AC

## 2020-03-30 MED ORDER — KETOROLAC TROMETHAMINE 30 MG/ML IJ SOLN
30.0000 mg | Freq: Once | INTRAMUSCULAR | Status: AC
  Administered 2020-03-30: 30 mg via INTRAVENOUS
  Filled 2020-03-30: qty 1

## 2020-03-30 MED ORDER — SODIUM CHLORIDE 0.9 % IV BOLUS
1000.0000 mL | Freq: Once | INTRAVENOUS | Status: AC
  Administered 2020-03-30: 10:00:00 1000 mL via INTRAVENOUS

## 2020-03-30 MED ORDER — AZITHROMYCIN 250 MG PO TABS: 500.0000 mg | ORAL_TABLET | Freq: Once | ORAL | Status: DC

## 2020-03-30 MED ORDER — FLUTICASONE PROPIONATE 50 MCG/ACT NA SUSP: 1.0000 | Freq: Every day | NASAL | 0 refills | Status: AC

## 2020-03-30 MED ORDER — IOHEXOL 300 MG/ML  SOLN
75.0000 mL | Freq: Once | INTRAMUSCULAR | Status: AC | PRN
  Administered 2020-03-30: 75 mL via INTRAVENOUS

## 2020-03-30 MED ORDER — LORATADINE 10 MG PO TABS: 10.0000 mg | ORAL_TABLET | Freq: Every day | ORAL | 0 refills | Status: AC

## 2020-03-30 NOTE — ED Provider Notes (Signed)
[facility] EMERGENCY DEPARTMENT Provider Note   CSN: [id number] Arrival date & time: 03/30/20  7564     History Chief Complaint  Patient presents with  . Dizziness  . Jaw Pain    Emily Woods is a 34 y.o. female.  Pt presents to the ED today with dizziness, bilateral ear pain, right jaw pain.  Pt said she's been feeling sick for about 2 months.  She was diagnosed with OM and dental caries.  She had a root canal last week.  Initially, she felt better, but symptoms have worsened.  She has completed her course of amox.  Pt is also feeling some palpitations.  She did drive here.  No cp or sob.  Pt also reports a vaginal d/c.  She 's recently been treated for trich.        Past Medical History:  Diagnosis Date  . Endometriosis     There are no problems to display for this patient.   Past Surgical History:  Procedure Laterality Date  . CHOLECYSTECTOMY       OB History   No obstetric history on file.     No family history on file.  Social History   Tobacco Use  . Smoking status: Not on file  Substance Use Topics  . Alcohol use: Not on file  . Drug use: Not on file    Home Medications Prior to Admission medications   Medication Sig Start Date End Date Taking? Authorizing Provider  phentermine 30 MG capsule Take 30 mg by mouth daily.   Yes [provider]  azithromycin (ZITHROMAX Z-PAK) 250 MG tablet Take 2 tablets on the first day, then take 1 tablet for the next 4 days. 03/30/20   Jacalyn Lefevre, MD  fluticasone (FLONASE) 50 MCG/ACT nasal spray Place 1 spray into both nostrils daily. 03/30/20   Jacalyn Lefevre, MD  HYDROcodone-acetaminophen (NORCO/VICODIN) 5-325 MG tablet Take 1 tablet by mouth every 4 (four) hours as needed. 03/30/20   Jacalyn Lefevre, MD  loratadine (CLARITIN) 10 MG tablet Take 1 tablet (10 mg total) by mouth daily. 03/30/20   Jacalyn Lefevre, MD  meclizine (ANTIVERT) 25 MG tablet Take 1 tablet (25 mg total) by  mouth 2 (two) times daily as needed for dizziness. Patient not taking: Reported on 03/30/2020 12/29/19   Roxy Horseman, PA-C    Allergies    Patient has no known allergies.  Review of Systems   Review of Systems  HENT: Positive for dental problem and ear pain.   Cardiovascular: Positive for palpitations.  Neurological: Positive for dizziness.  All other systems reviewed and are negative.   Physical Exam Updated Vital Signs BP 109/65   Pulse 72   Temp 98.3 F (36.8 C) (Oral)   Resp 18   Ht 5\' 4"  (1.626 m)   Wt 122.5 kg   LMP 03/19/2020   SpO2 100%   BMI 46.35 kg/m   Physical Exam Vitals and nursing note reviewed. Exam conducted with a chaperone present.  Constitutional:      Appearance: Normal appearance.  HENT:     Head: Normocephalic and atraumatic.     Right Ear: External ear normal. Tenderness present. Tympanic membrane is erythematous.     Left Ear: Swelling and tenderness present.     Nose: Nose normal.     Mouth/Throat:     Mouth: Mucous membranes are moist.     Pharynx: Oropharynx is clear.  Eyes:     Extraocular Movements: Extraocular movements  intact.     Conjunctiva/sclera: Conjunctivae normal.     Pupils: Pupils are equal, round, and reactive to light.  Cardiovascular:     Rate and Rhythm: Normal rate and regular rhythm.     Pulses: Normal pulses.     Heart sounds: Normal heart sounds.     Comments: Some PVCs on the monitor Pulmonary:     Effort: Pulmonary effort is normal.     Breath sounds: Normal breath sounds.  Abdominal:     General: Abdomen is flat. Bowel sounds are normal.     Palpations: Abdomen is soft.  Genitourinary:    Exam position: Lithotomy position.     Cervix: Discharge present.     Uterus: Normal.      Adnexa: Right adnexa normal and left adnexa normal.  Musculoskeletal:        General: Normal range of motion.  Lymphadenopathy:     Cervical: Cervical adenopathy present.     Right cervical: Superficial cervical adenopathy  and deep cervical adenopathy present.  Skin:    General: Skin is warm.     Capillary Refill: Capillary refill takes less than 2 seconds.  Neurological:     General: No focal deficit present.     Mental Status: She is alert and oriented to person, place, and time.  Psychiatric:        Mood and Affect: Mood normal.        Behavior: Behavior normal.     ED Results / Procedures / Treatments   Labs (all labs ordered are listed, but only abnormal results are displayed) Labs Reviewed  WET PREP, GENITAL - Abnormal; Notable for the following components:      Result Value   WBC, Wet Prep HPF POC MODERATE (*)    All other components within normal limits  COMPREHENSIVE METABOLIC PANEL - Abnormal; Notable for the following components:   Calcium 8.8 (*)    Total Protein 6.4 (*)    Albumin 3.1 (*)    All other components within normal limits  CBC WITH DIFFERENTIAL/PLATELET  URINALYSIS, ROUTINE W REFLEX MICROSCOPIC  PREGNANCY, URINE  GC/CHLAMYDIA PROBE AMP ([place]) NOT AT [facility]  TROPONIN I (HIGH SENSITIVITY)  TROPONIN I (HIGH SENSITIVITY)    EKG EKG Interpretation  Date/Time:  Wednesday Mar 30 2020 09:09:34 EDT Ventricular Rate:  101 PR Interval:  154 QRS Duration: 82 QT Interval:  340 QTC Calculation: 440 R Axis:   86 Text Interpretation: Sinus tachycardia Nonspecific T wave abnormality Abnormal ECG Since last tracing rate faster Confirmed by Jacalyn Lefevre [phone]) on 03/30/2020 10:16:38 AM   Radiology DG Chest 2 View  Result Date: 03/30/2020 CLINICAL DATA:  Provided history: Dizzy. Additional history provided: Dizziness, bilateral ear pain, confusion, right jaw pain for 1 week, feeling sick for 2 months, recently diagnosed with bilateral ear infection and abscess in jaw. EXAM: CHEST - 2 VIEW COMPARISON:  No pertinent prior studies available for comparison. FINDINGS: Heart size within normal limits. There is no appreciable airspace consolidation. No evidence of pleural effusion  or pneumothorax. No acute bony abnormality identified. IMPRESSION: No evidence of acute cardiopulmonary abnormality. Electronically Signed   By: Jackey Loge DO   On: 03/30/2020 10:49   CT Soft Tissue Neck W Contrast  Result Date: 03/30/2020 CLINICAL DATA:  Dizziness, bilateral ear pain, right jaw pain EXAM: CT NECK WITH CONTRAST TECHNIQUE: Multidetector CT imaging of the neck was performed using the standard protocol following the bolus administration of intravenous contrast. CONTRAST:  17mL OMNIPAQUE  IOHEXOL 300 MG/ML  SOLN COMPARISON:  None. FINDINGS: Pharynx and larynx: Unremarkable.  No mass or swelling. Salivary glands: Unremarkable. Thyroid: Normal. Lymph nodes: There are no enlarged or abnormal density lymph nodes. Vascular: Major neck vessels patent. Limited intracranial: No abnormal enhancement. Visualized orbits: Unremarkable. Mastoids and visualized paranasal sinuses: Aerated. Skeleton: Unremarkable. Upper chest: Included lung apices are clear. Other: None. IMPRESSION: No mass, adenopathy, or evidence of inflammatory changes. Electronically Signed   By: Macy Mis M.D.   On: 03/30/2020 13:05    Procedures Procedures (including critical care time)  Medications Ordered in ED Medications  sodium chloride 0.9 % bolus 1,000 mL (0 mLs Intravenous Stopped 03/30/20 1310)  ketorolac (TORADOL) 30 MG/ML injection 30 mg (30 mg Intravenous Given 03/30/20 1016)  iohexol (OMNIPAQUE) 300 MG/ML solution 75 mL (75 mLs Intravenous Contrast Given 03/30/20 1252)    ED Course  I have reviewed the triage vital signs and the nursing notes.  Pertinent labs & imaging results that were available during my care of the patient were reviewed by me and considered in my medical decision making (see chart for details).    MDM Rules/Calculators/A&P                      Pt is feeling much better.  She has a recurrent om likely from seasonal allergies.  She will be started on claritin and flonase.  Pelvic swabs  nl.  Pt told to take probiotics when on abx.  She knows to return if worse.  F/u with pcp.   Final Clinical Impression(s) / ED Diagnoses Final diagnoses:  Recurrent acute suppurative otitis media of right ear without spontaneous rupture of tympanic membrane    Rx / DC Orders ED Discharge Orders         Ordered    azithromycin (ZITHROMAX Z-PAK) 250 MG tablet     03/30/20 1439    loratadine (CLARITIN) 10 MG tablet  Daily     03/30/20 1439    fluticasone (FLONASE) 50 MCG/ACT nasal spray  Daily     03/30/20 1439    HYDROcodone-acetaminophen (NORCO/VICODIN) 5-325 MG tablet  Every 4 hours PRN     03/30/20 1502           Isla Pence, MD 03/30/20 1504

## 2020-03-30 NOTE — ED Notes (Signed)
Pt was taken to radiology.

## 2020-03-30 NOTE — ED Notes (Signed)
Pt reports many symptoms including "heart fluttering", placed pt on cardiac monitor.

## 2020-03-30 NOTE — ED Triage Notes (Signed)
C/o dizziness, bilateral ear pain, confusion, and R jaw pain x 1 week.    States she has been feeling sick for 2 months and seen at [facility] and diagnosed with bilateral ear infection and abscess in jaw a few weeks ago.  Took antibiotic and had a root canal.  Symptoms initially went away but are now worse.  No arm drift.

## 2020-03-31 LAB — GC/CHLAMYDIA PROBE AMP (~~LOC~~) NOT AT ARMC
Chlamydia: NEGATIVE
Comment: NEGATIVE
Comment: NORMAL
Neisseria Gonorrhea: NEGATIVE

## 2021-07-19 IMAGING — CT CT HEAD W/O CM
3 series · 14 of 47 positions shown, 16 images · non-contrast
Comparison: None.

CLINICAL DATA: Headache and dizziness

EXAM:
CT HEAD WITHOUT CONTRAST
TECHNIQUE: Contiguous axial images were obtained from the base of the skull
through the vertex without intravenous contrast.

[Series 3: head 5.0 h30s · axial · 0.43mm/px · z∈[-72,+68]mm · 8 of 34 slices shown, 10 images]
[im 3/34  brain]
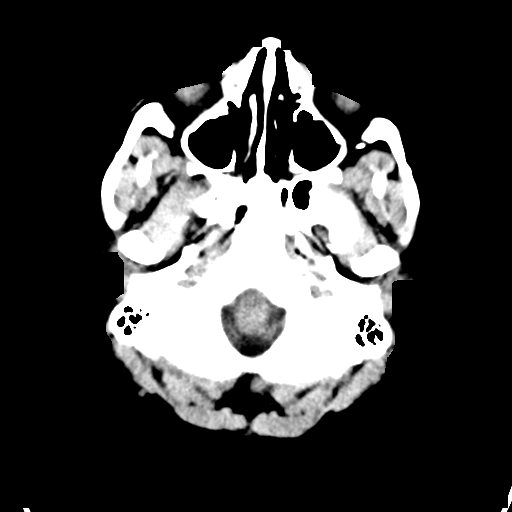
[im 3/34  bone]
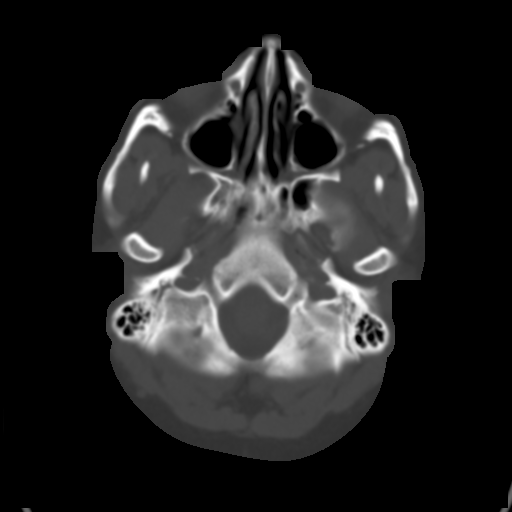
[im 7/34  brain]
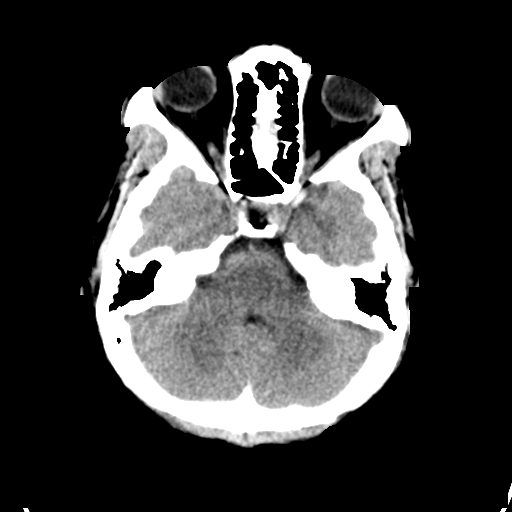
[im 11/34  brain]
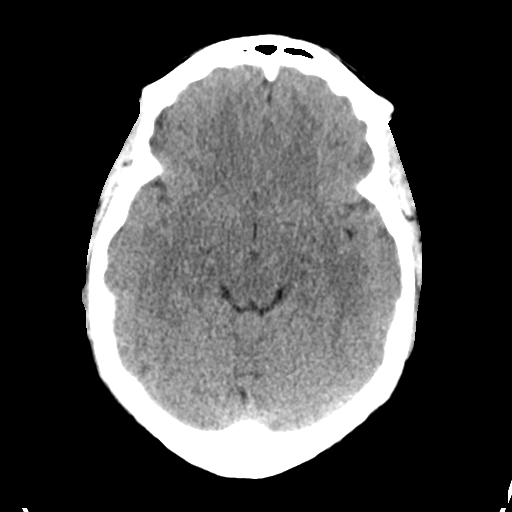
[im 15/34  brain]
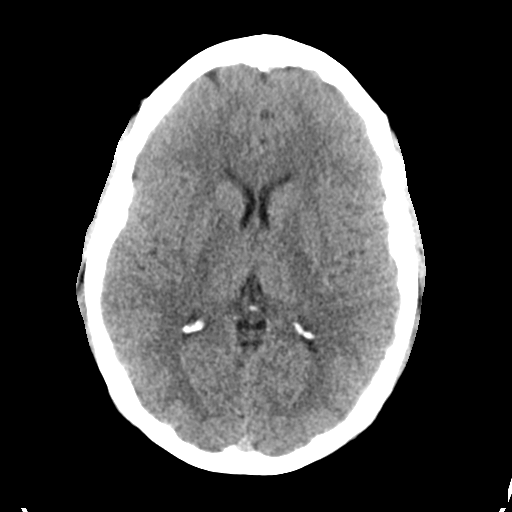
[im 19/34  brain]
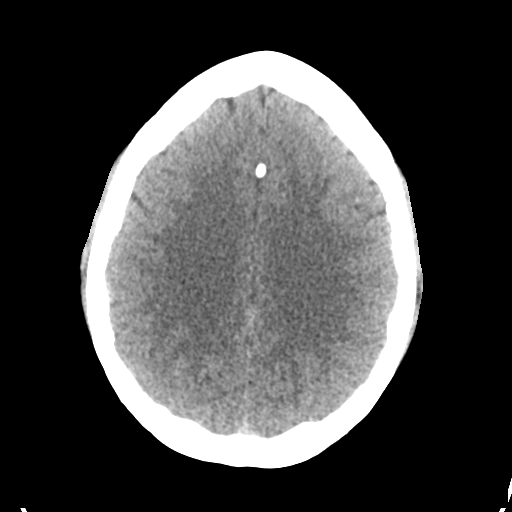
[im 19/34  bone]
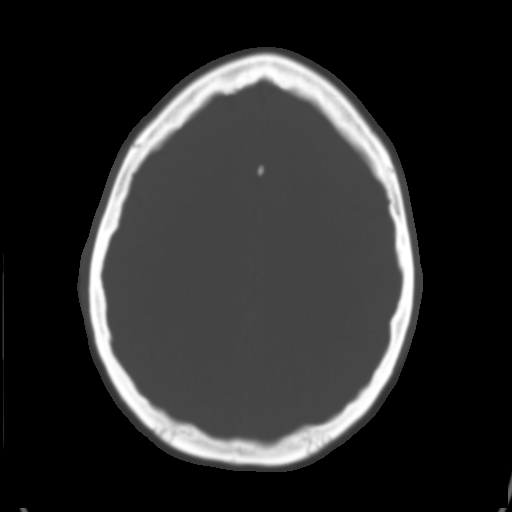
[im 23/34  brain]
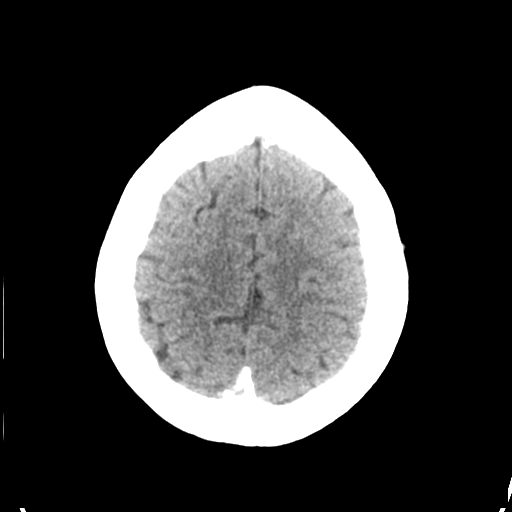
[im 27/34  brain]
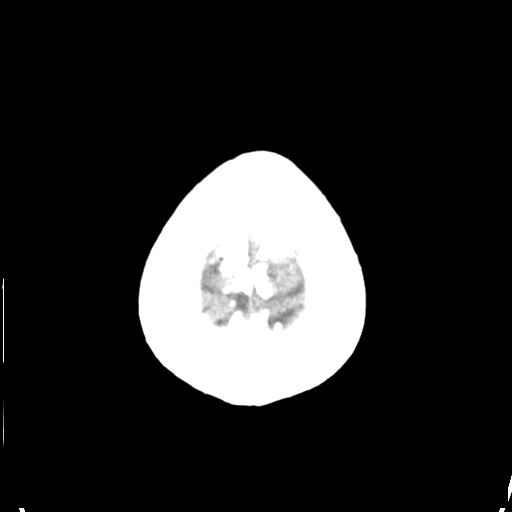
[im 31/34  brain]
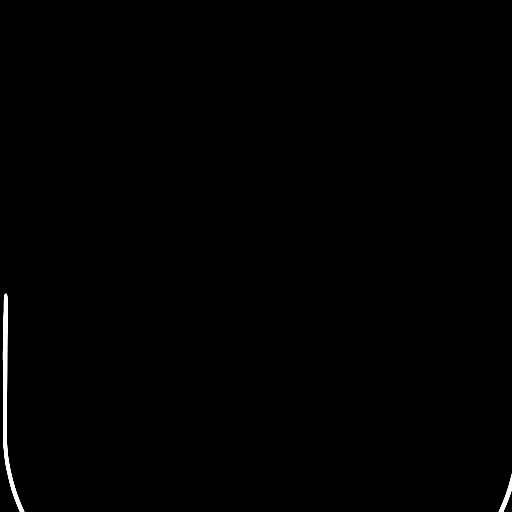

[Series 5: head 3.0 mpr cor · coronal · 0.33mm/px · 3 of 67 slices shown]
[im 23/67  brain]
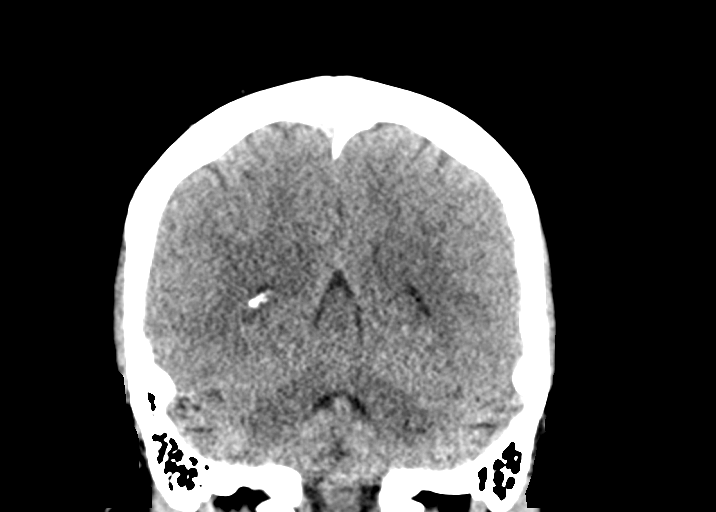
[im 30/67  brain]
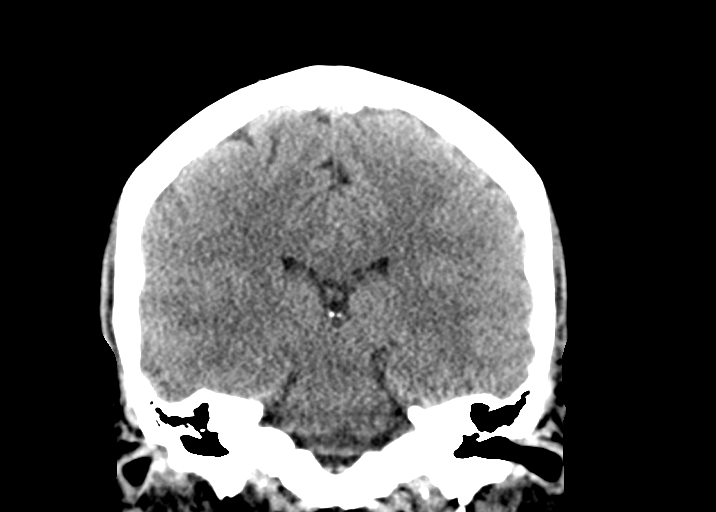
[im 37/67  brain]
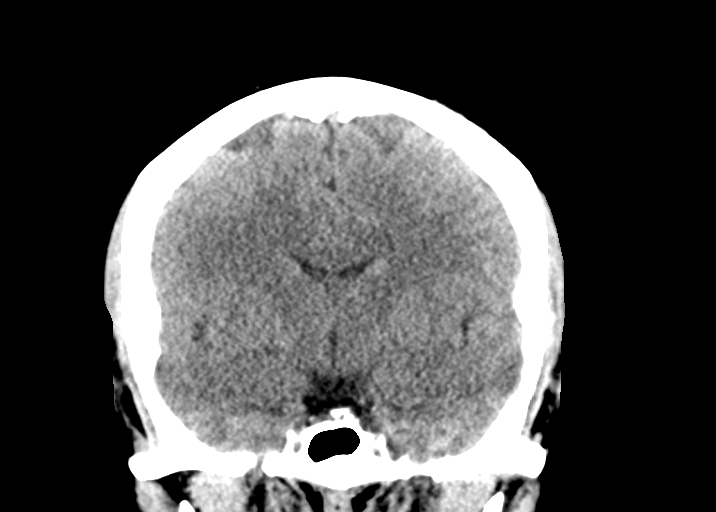

[Series 6: head 3.0 mpr sag · sagittal · 0.33mm/px · 3 of 65 slices shown]
[im 22/65  brain]
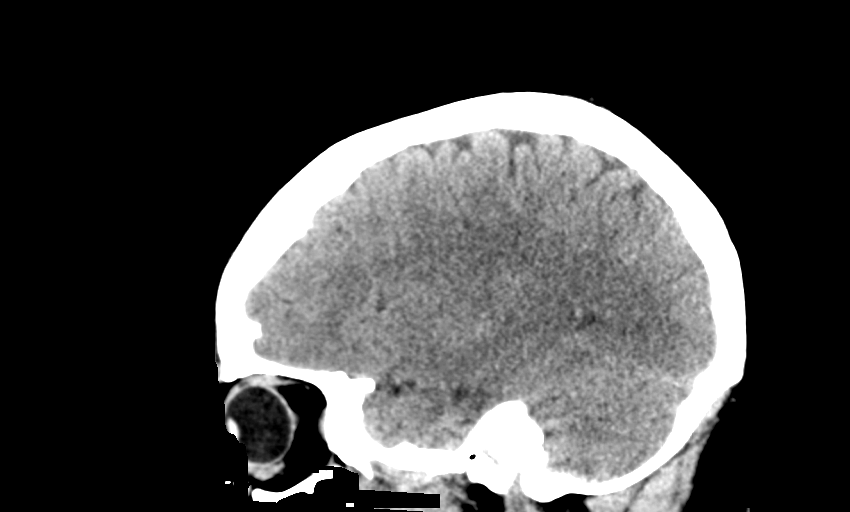
[im 33/65  brain]
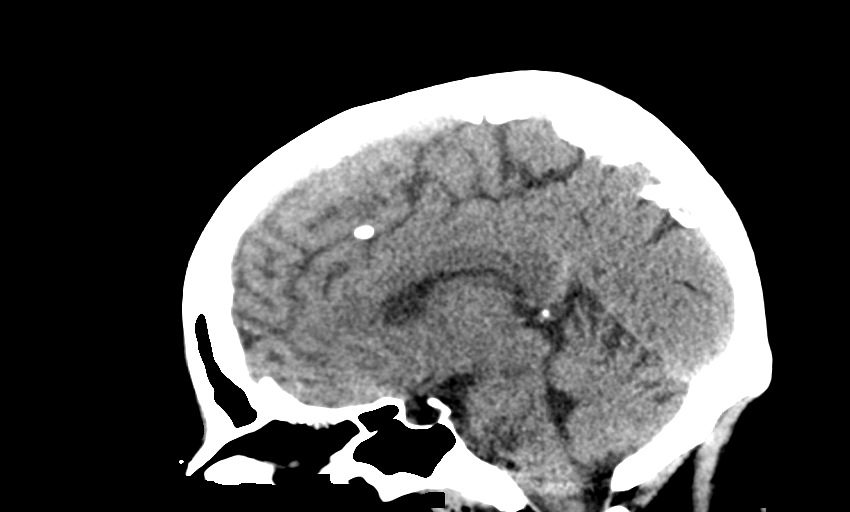
[im 43/65  brain]
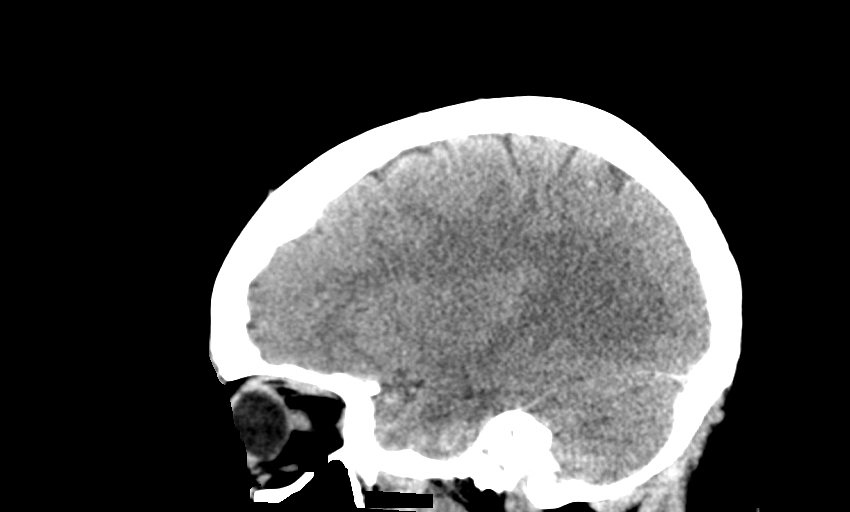

[14 of 47 positions shown; findings below may reference images not displayed]

FINDINGS: Brain: There is no mass, hemorrhage or extra-axial collection. The
size and configuration of the ventricles and extra-axial CSF spaces
are normal. The brain parenchyma is normal, without acute or chronic
infarction.

Vascular: No abnormal hyperdensity of the major intracranial
arteries or dural venous sinuses. No intracranial atherosclerosis.

Skull: The visualized skull base, calvarium and extracranial soft
tissues are normal.

Sinuses/Orbits: No fluid levels or advanced mucosal thickening of
the visualized paranasal sinuses. No mastoid or middle ear effusion.
The orbits are normal.
IMPRESSION: Normal head CT.

## 2021-10-20 IMAGING — CT CT NECK W/ CM
4 series · 15 of 33 positions shown, 18 images · IV contrast (Omni 300)
Comparison: None.

CLINICAL DATA: Dizziness, bilateral ear pain, right jaw pain

EXAM:
CT NECK WITH CONTRAST
TECHNIQUE: Multidetector CT imaging of the neck was performed using the
standard protocol following the bolus administration of intravenous
contrast.
CONTRAST:  75mL OMNIPAQUE IOHEXOL 300 MG/ML  SOLN

[Series 3: neck 2.0 st · axial · 0.47mm/px · z∈[+280,+406]mm · 5 of 95 slices shown, 7 images]
[im 16/95  soft-tissue]
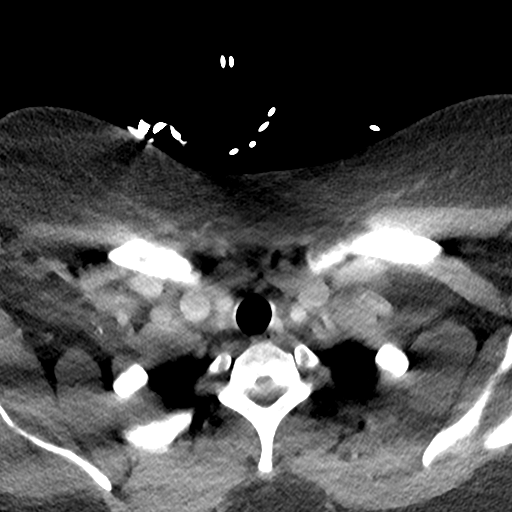
[im 16/95  bone]
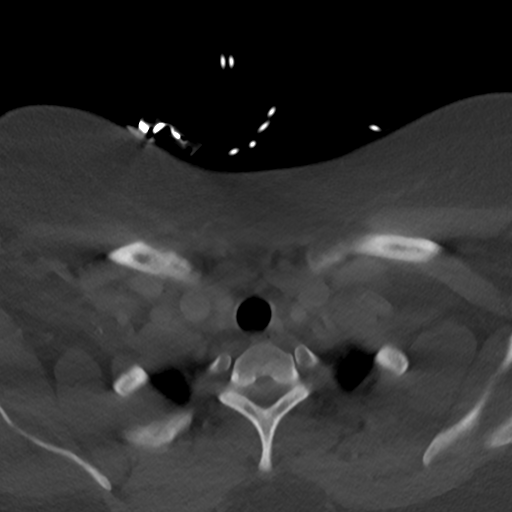
[im 32/95  bone]
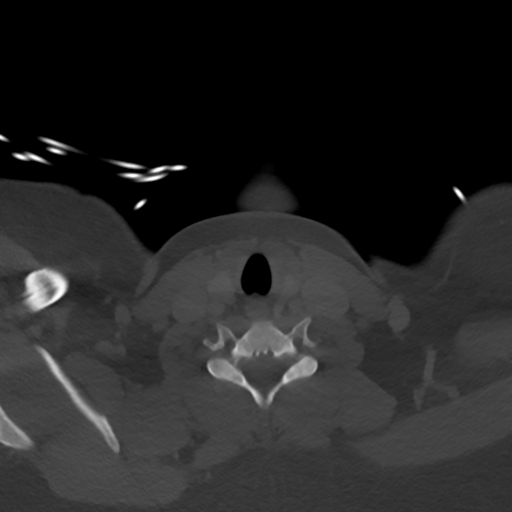
[im 48/95  bone]
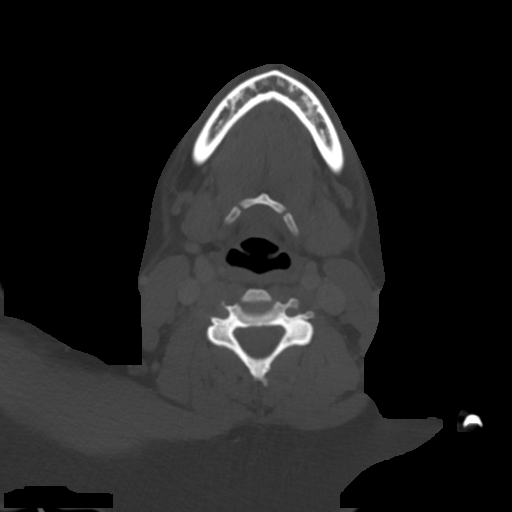
[im 63/95  bone]
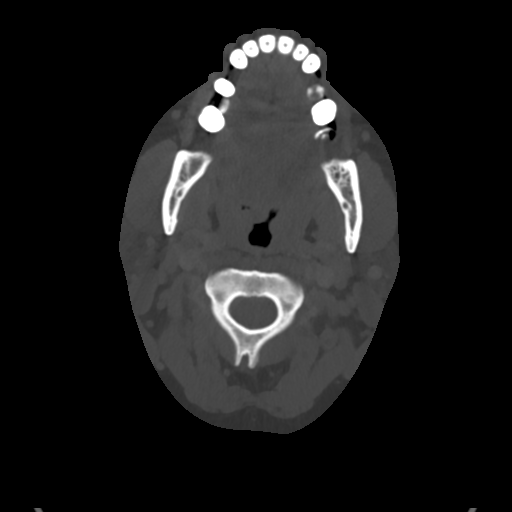
[im 79/95  soft-tissue]
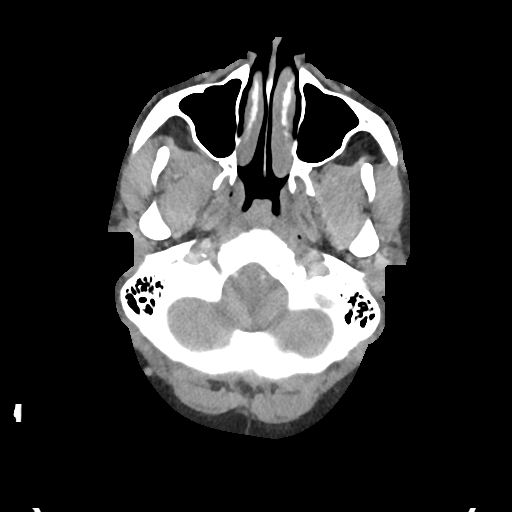
[im 79/95  bone]
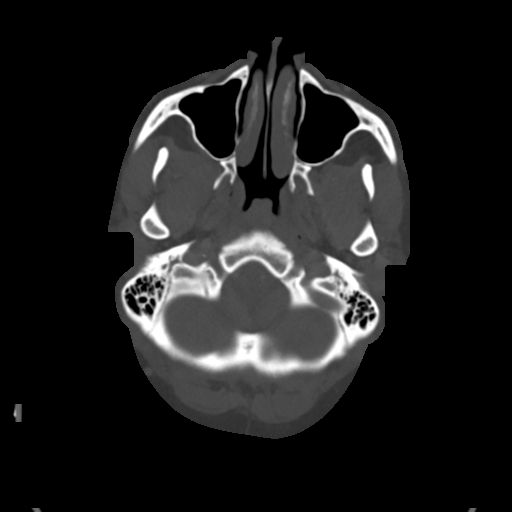

[Series 6: sagittal · sagittal · 0.37mm/px · 5 of 101 slices shown, 6 images]
[im 34/101  bone]
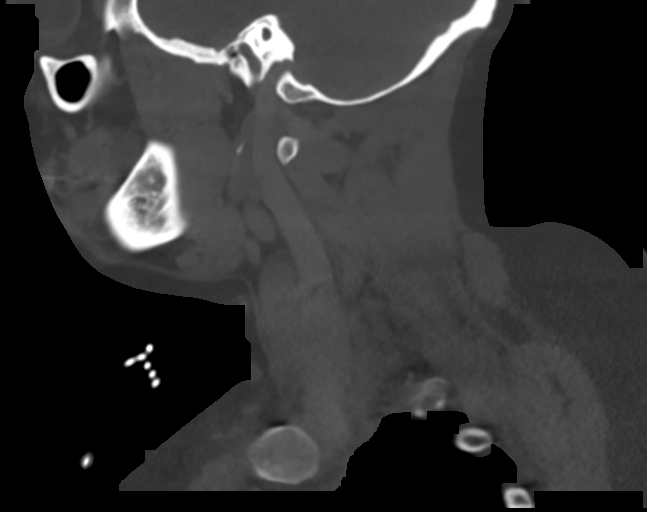
[im 42/101  bone]
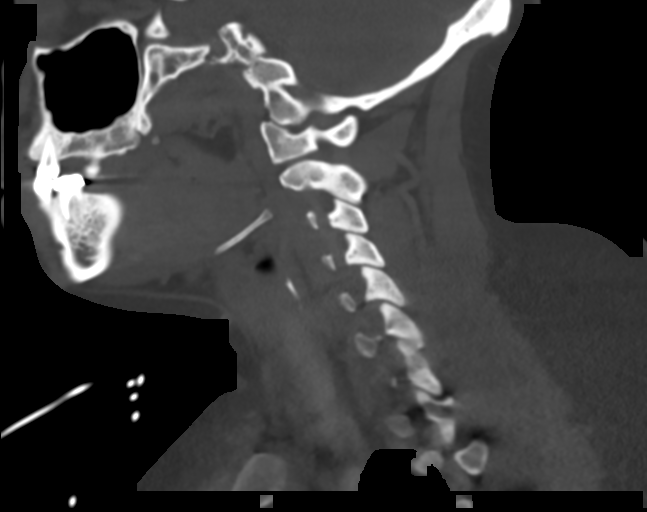
[im 51/101  soft-tissue]
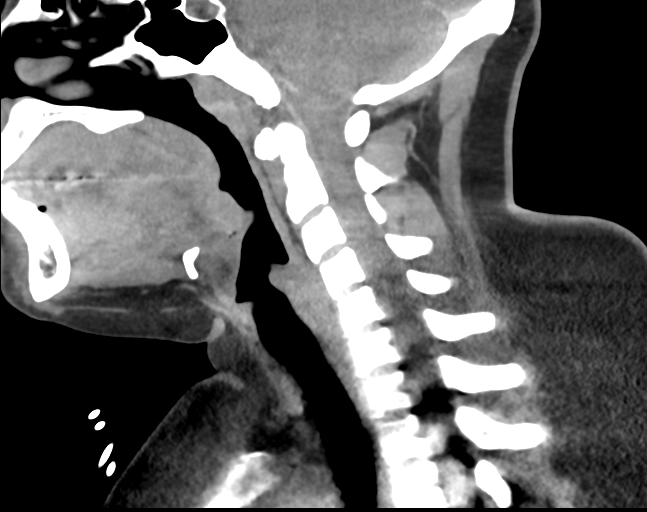
[im 51/101  bone]
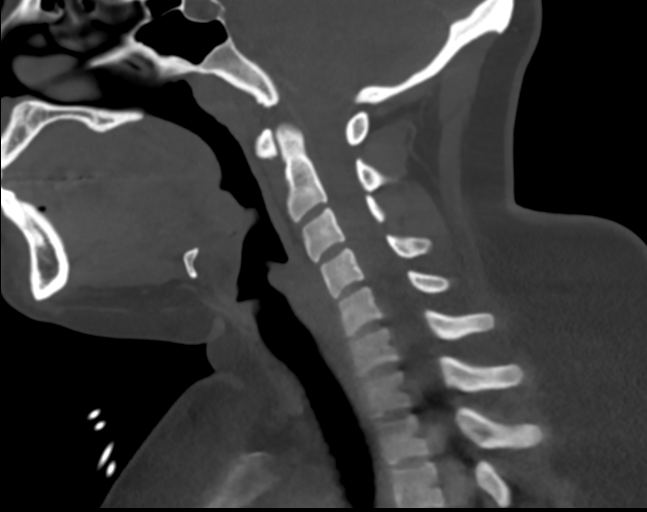
[im 59/101  bone]
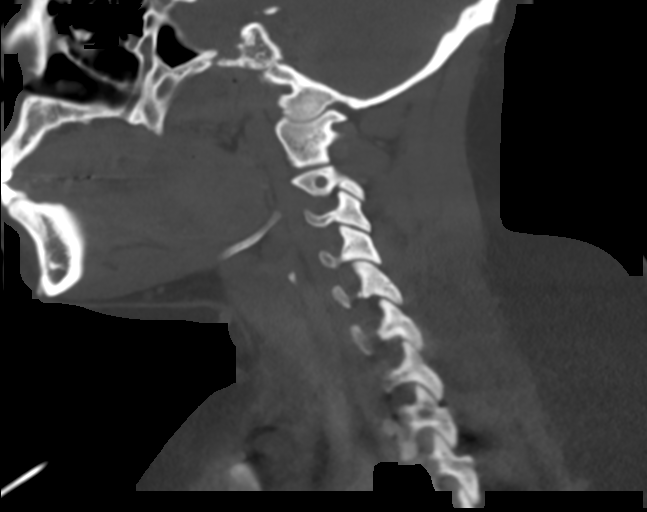
[im 67/101  bone]
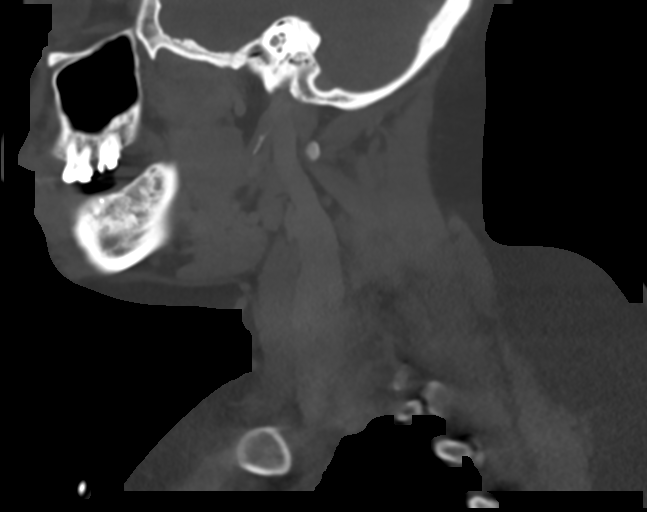

[Series 7: coronal · coronal · 0.37mm/px · 3 of 101 slices shown]
[im 30/101  bone]
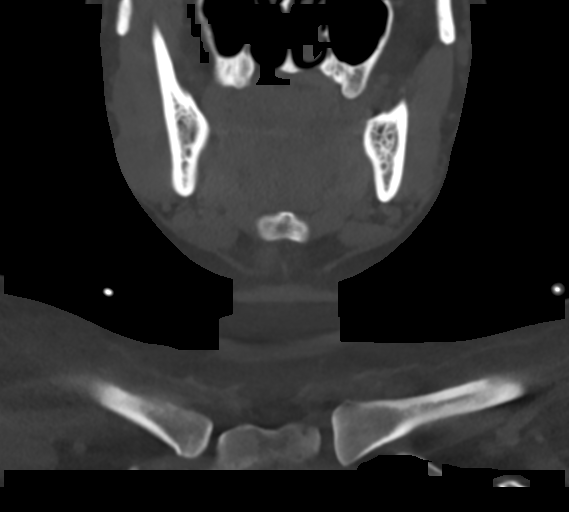
[im 44/101  bone]
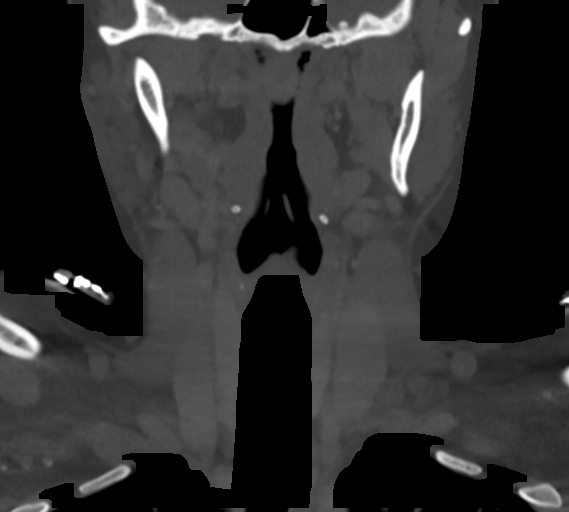
[im 58/101  bone]
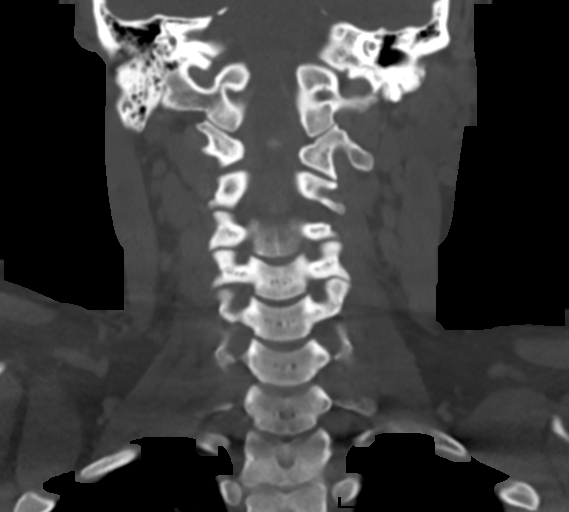

[Series 8: orthogonal · axial · 0.39mm/px · z∈[+248,+277]mm · 2 of 94 slices shown]
[im 16/94  bone]
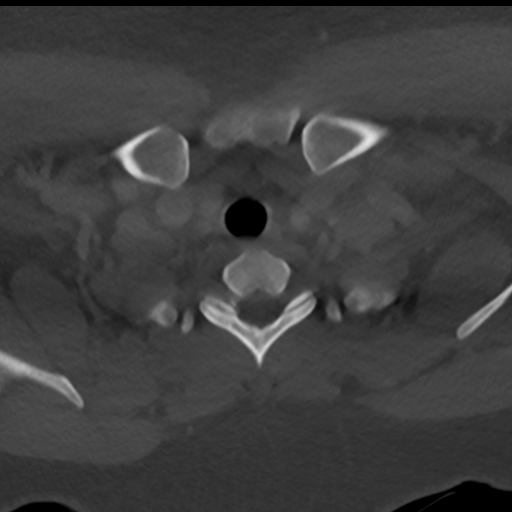
[im 32/94  bone]
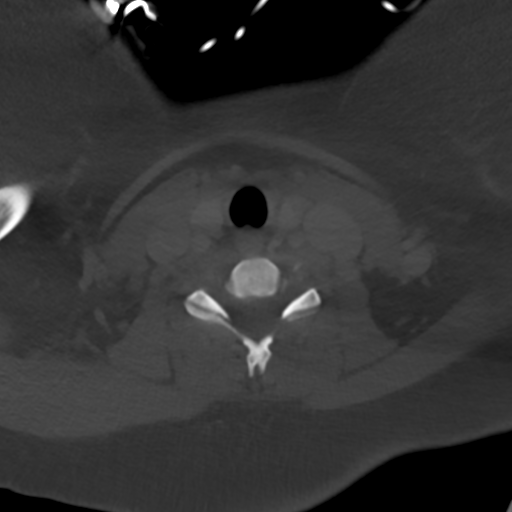

[15 of 33 positions shown; findings below may reference images not displayed]

FINDINGS: Pharynx and larynx: Unremarkable.  No mass or swelling.

Salivary glands: Unremarkable.

Thyroid: Normal.

Lymph nodes: There are no enlarged or abnormal density lymph nodes.

Vascular: Major neck vessels patent.

Limited intracranial: No abnormal enhancement.

Visualized orbits: Unremarkable.

Mastoids and visualized paranasal sinuses: Aerated.

Skeleton: Unremarkable.

Upper chest: Included lung apices are clear.

Other: None.
IMPRESSION: No mass, adenopathy, or evidence of inflammatory changes.
